# Patient Record
Sex: Male | Born: 1995 | Race: White | Hispanic: No | Marital: Single | Smoking: Never smoker
Health system: Southern US, Community
[De-identification: ages and names within clinical notes are randomized; demographics above are authoritative.]

## PROBLEM LIST (undated history)

## (undated) HISTORY — PX: TYMPANOSTOMY TUBE PLACEMENT: SHX32

## (undated) HISTORY — PX: ADENOIDECTOMY: SUR15

---
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
## 2015-12-04 ENCOUNTER — Encounter (HOSPITAL_COMMUNITY): Payer: Self-pay | Admitting: Emergency Medicine

## 2015-12-04 ENCOUNTER — Emergency Department (HOSPITAL_COMMUNITY): Payer: BLUE CROSS/BLUE SHIELD

## 2015-12-04 ENCOUNTER — Emergency Department (HOSPITAL_COMMUNITY)
Admission: EM | Admit: 2015-12-04 | Discharge: 2015-12-04 | Disposition: A | Discharge status: Home / Self Care | Payer: BLUE CROSS/BLUE SHIELD | Attending: Emergency Medicine | Admitting: Emergency Medicine

## 2015-12-04 DIAGNOSIS — S61411A Laceration without foreign body of right hand, initial encounter: Secondary | ICD-10-CM | POA: Insufficient documentation

## 2015-12-04 DIAGNOSIS — S80211A Abrasion, right knee, initial encounter: Secondary | ICD-10-CM | POA: Diagnosis not present

## 2015-12-04 DIAGNOSIS — Y999 Unspecified external cause status: Secondary | ICD-10-CM | POA: Insufficient documentation

## 2015-12-04 DIAGNOSIS — S1181XA Laceration without foreign body of other specified part of neck, initial encounter: Secondary | ICD-10-CM | POA: Diagnosis not present

## 2015-12-04 DIAGNOSIS — S0181XA Laceration without foreign body of other part of head, initial encounter: Secondary | ICD-10-CM | POA: Insufficient documentation

## 2015-12-04 DIAGNOSIS — Y929 Unspecified place or not applicable: Secondary | ICD-10-CM | POA: Diagnosis not present

## 2015-12-04 DIAGNOSIS — S6991XA Unspecified injury of right wrist, hand and finger(s), initial encounter: Secondary | ICD-10-CM | POA: Diagnosis present

## 2015-12-04 DIAGNOSIS — Y939 Activity, unspecified: Secondary | ICD-10-CM | POA: Insufficient documentation

## 2015-12-04 DIAGNOSIS — S01419A Laceration without foreign body of unspecified cheek and temporomandibular area, initial encounter: Secondary | ICD-10-CM | POA: Insufficient documentation

## 2015-12-04 MED ORDER — TETANUS-DIPHTH-ACELL PERTUSSIS 5-2.5-18.5 LF-MCG/0.5 IM SUSP
0.5000 mL | Freq: Once | INTRAMUSCULAR | Status: AC
  Administered 2015-12-04: 0.5 mL via INTRAMUSCULAR
  Filled 2015-12-04: qty 0.5

## 2015-12-04 MED ORDER — HYDROCODONE-ACETAMINOPHEN 5-325 MG PO TABS
1.0000 | ORAL_TABLET | Freq: Once | ORAL | Status: AC
  Administered 2015-12-04: 1 via ORAL
  Filled 2015-12-04: qty 1

## 2015-12-04 MED ORDER — BACITRACIN ZINC 500 UNIT/GM EX OINT
TOPICAL_OINTMENT | CUTANEOUS | Status: AC
  Filled 2015-12-04: qty 0.9
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
## 2015-12-04 NOTE — ED Notes (Signed)
Wounds cleansed with soap and water. Bacitracin applied. 2 steri-strips applied to left neck abrasions.

## 2015-12-05 NOTE — ED Provider Notes (Signed)
Pt's mother called ED: states pt did not receive rx for pain meds, mm relaxer, etc. EPIC chart reviewed: no rx noted. Rx written for #20 hydrocodone 5/APAP 325mg  tabs, #14 naproxen 250mg  tabs, #25 robaxin 500mg  tabs.   Samuel JesterKathleen Charliegh Vasudevan, DO 12/05/15 1430

## 2018-05-23 ENCOUNTER — Other Ambulatory Visit
Admission: RE | Admit: 2018-05-23 | Discharge: 2018-05-23 | Disposition: A | Discharge status: Home / Self Care | Attending: Family Medicine | Admitting: Family Medicine
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
## 2021-12-13 ENCOUNTER — Emergency Department: Payer: BC Managed Care – PPO

## 2021-12-13 ENCOUNTER — Emergency Department
Admission: EM | Admit: 2021-12-13 | Discharge: 2021-12-14 | Disposition: A | Discharge status: Home / Self Care | Payer: BC Managed Care – PPO | Attending: Emergency Medicine | Admitting: Emergency Medicine

## 2021-12-13 ENCOUNTER — Encounter: Payer: Self-pay | Admitting: Emergency Medicine

## 2021-12-13 DIAGNOSIS — Z23 Encounter for immunization: Secondary | ICD-10-CM | POA: Diagnosis not present

## 2021-12-13 DIAGNOSIS — S52611A Displaced fracture of right ulna styloid process, initial encounter for closed fracture: Secondary | ICD-10-CM | POA: Diagnosis not present

## 2021-12-13 DIAGNOSIS — S52571A Other intraarticular fracture of lower end of right radius, initial encounter for closed fracture: Secondary | ICD-10-CM | POA: Diagnosis not present

## 2021-12-13 DIAGNOSIS — Y92480 Sidewalk as the place of occurrence of the external cause: Secondary | ICD-10-CM | POA: Diagnosis not present

## 2021-12-13 DIAGNOSIS — S0990XA Unspecified injury of head, initial encounter: Secondary | ICD-10-CM | POA: Diagnosis not present

## 2021-12-13 DIAGNOSIS — S52501A Unspecified fracture of the lower end of right radius, initial encounter for closed fracture: Secondary | ICD-10-CM

## 2021-12-13 DIAGNOSIS — Y9351 Activity, roller skating (inline) and skateboarding: Secondary | ICD-10-CM | POA: Diagnosis not present

## 2021-12-13 DIAGNOSIS — W19XXXA Unspecified fall, initial encounter: Secondary | ICD-10-CM

## 2021-12-13 DIAGNOSIS — M25531 Pain in right wrist: Secondary | ICD-10-CM | POA: Diagnosis not present

## 2021-12-13 DIAGNOSIS — S6991XA Unspecified injury of right wrist, hand and finger(s), initial encounter: Secondary | ICD-10-CM | POA: Diagnosis present

## 2021-12-13 MED ORDER — FENTANYL CITRATE PF 50 MCG/ML IJ SOSY
100.0000 ug | PREFILLED_SYRINGE | Freq: Once | INTRAMUSCULAR | Status: AC
  Administered 2021-12-13: 100 ug via INTRAVENOUS
  Filled 2021-12-13: qty 2

## 2021-12-13 MED ORDER — TETANUS-DIPHTH-ACELL PERTUSSIS 5-2.5-18.5 LF-MCG/0.5 IM SUSY
0.5000 mL | PREFILLED_SYRINGE | Freq: Once | INTRAMUSCULAR | Status: AC
  Administered 2021-12-13: 0.5 mL via INTRAMUSCULAR
  Filled 2021-12-13: qty 0.5

## 2021-12-13 MED ORDER — CEPHALEXIN 500 MG PO CAPS
500.0000 mg | ORAL_CAPSULE | Freq: Once | ORAL | Status: AC
  Administered 2021-12-13: 500 mg via ORAL
  Filled 2021-12-13: qty 1

## 2021-12-13 MED ORDER — HYDROMORPHONE HCL 1 MG/ML IJ SOLN
0.5000 mg | Freq: Once | INTRAMUSCULAR | Status: AC
Start: 2021-12-13 — End: 2021-12-13
  Administered 2021-12-13: 0.5 mg via INTRAVENOUS
  Filled 2021-12-13: qty 0.5

## 2021-12-13 MED ORDER — HYDROMORPHONE HCL 1 MG/ML IJ SOLN
1.0000 mg | Freq: Once | INTRAMUSCULAR | Status: AC
  Administered 2021-12-13: 1 mg via INTRAVENOUS
  Filled 2021-12-13: qty 1

## 2021-12-13 MED ORDER — LIDOCAINE-EPINEPHRINE 2 %-1:100000 IJ SOLN
20.0000 mL | Freq: Once | INTRAMUSCULAR | Status: AC
  Administered 2021-12-13: 20 mL
  Filled 2021-12-13: qty 1

## 2021-12-13 MED ORDER — CEPHALEXIN 500 MG PO CAPS: 500.0000 mg | ORAL_CAPSULE | Freq: Three times a day (TID) | ORAL | 0 refills | Status: AC

## 2021-12-13 MED ORDER — OXYCODONE HCL 5 MG PO TABS: 5.0000 mg | ORAL_TABLET | Freq: Three times a day (TID) | ORAL | 0 refills | Status: AC | PRN
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
## 2022-12-06 IMAGING — CR DG SHOULDER 1V*R*
2 series · 2 of 2 positions shown · non-contrast
Comparison: None Available.

CLINICAL DATA: Recent skateboard injury with right shoulder pain,
initial encounter

EXAM:
RIGHT SHOULDER - 1 VIEW

[shoulder grashey]
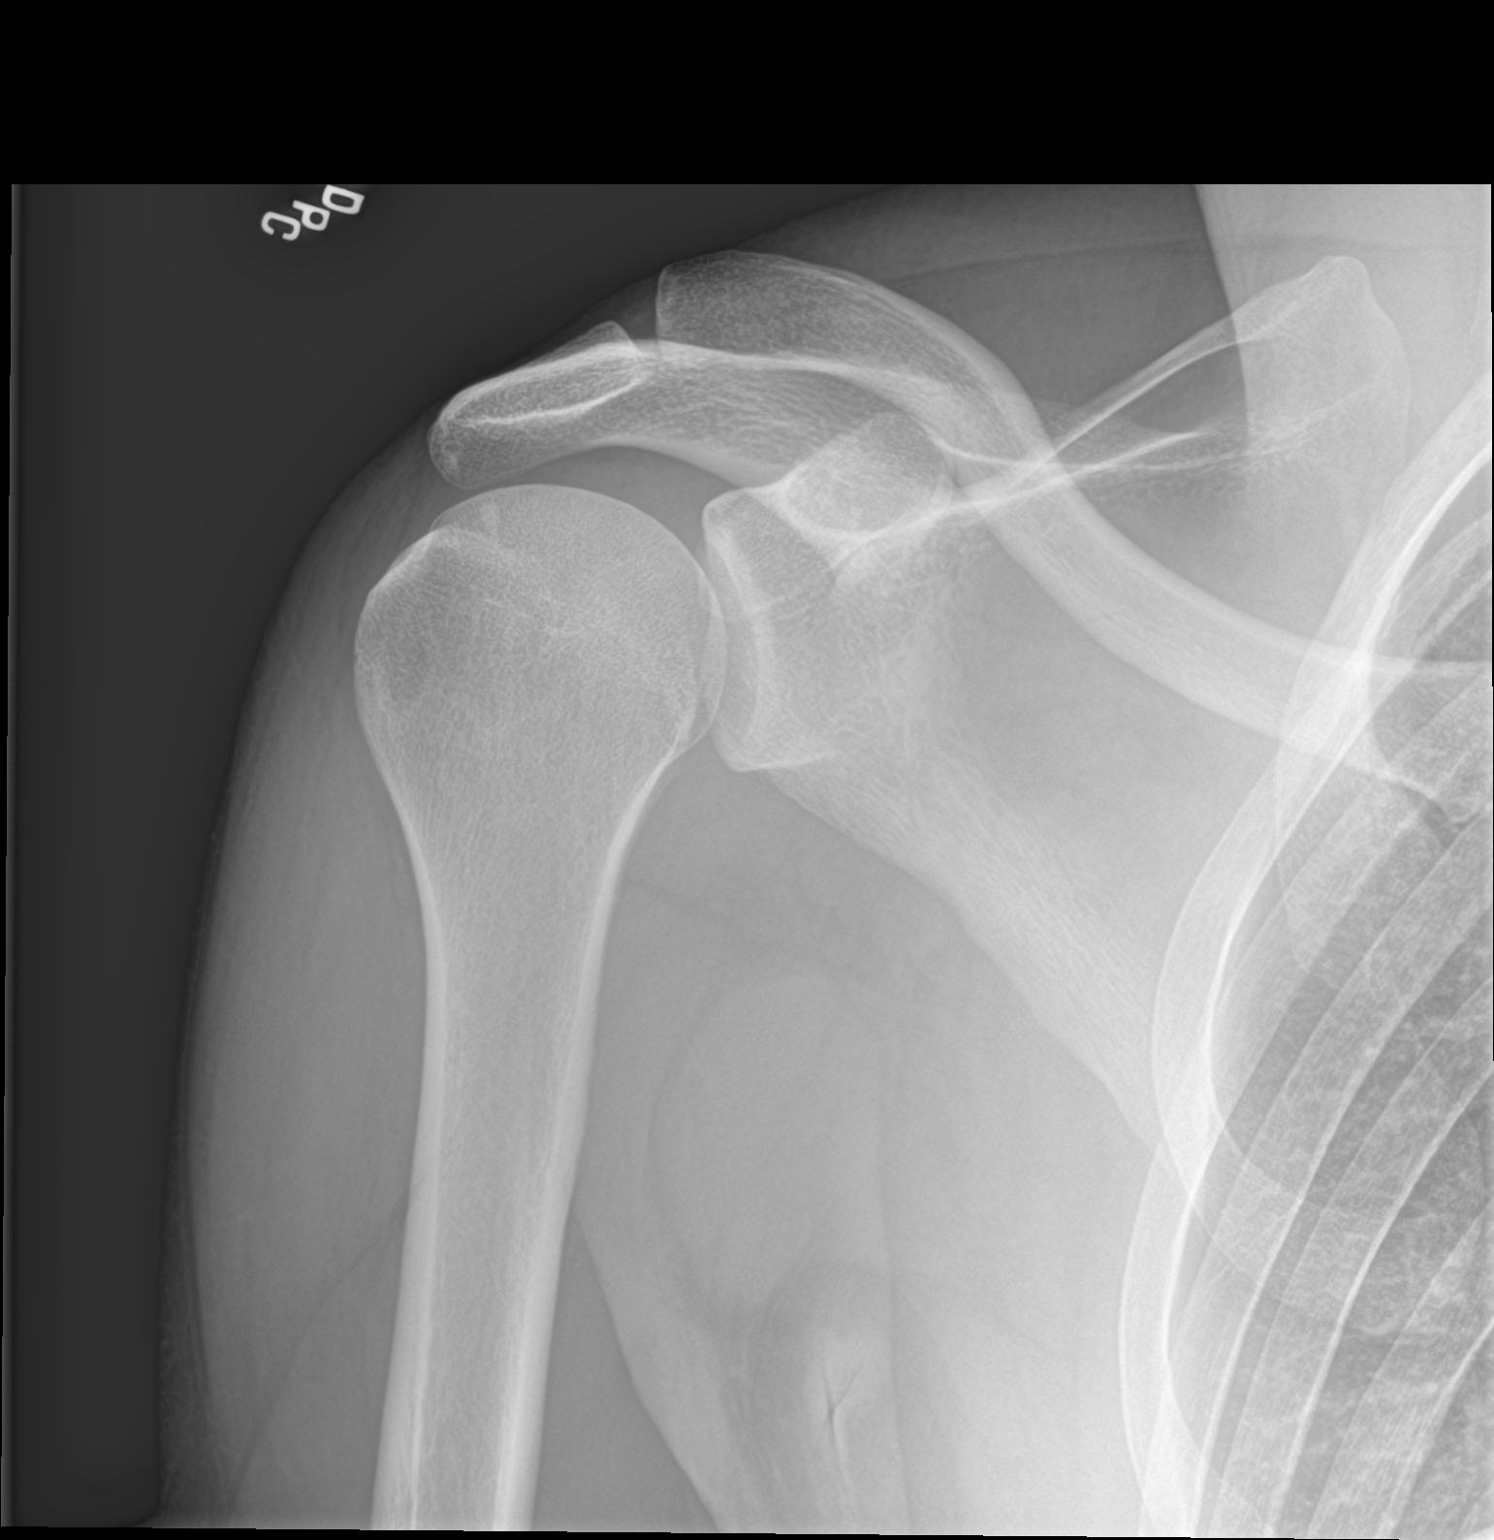

[scapula lat]
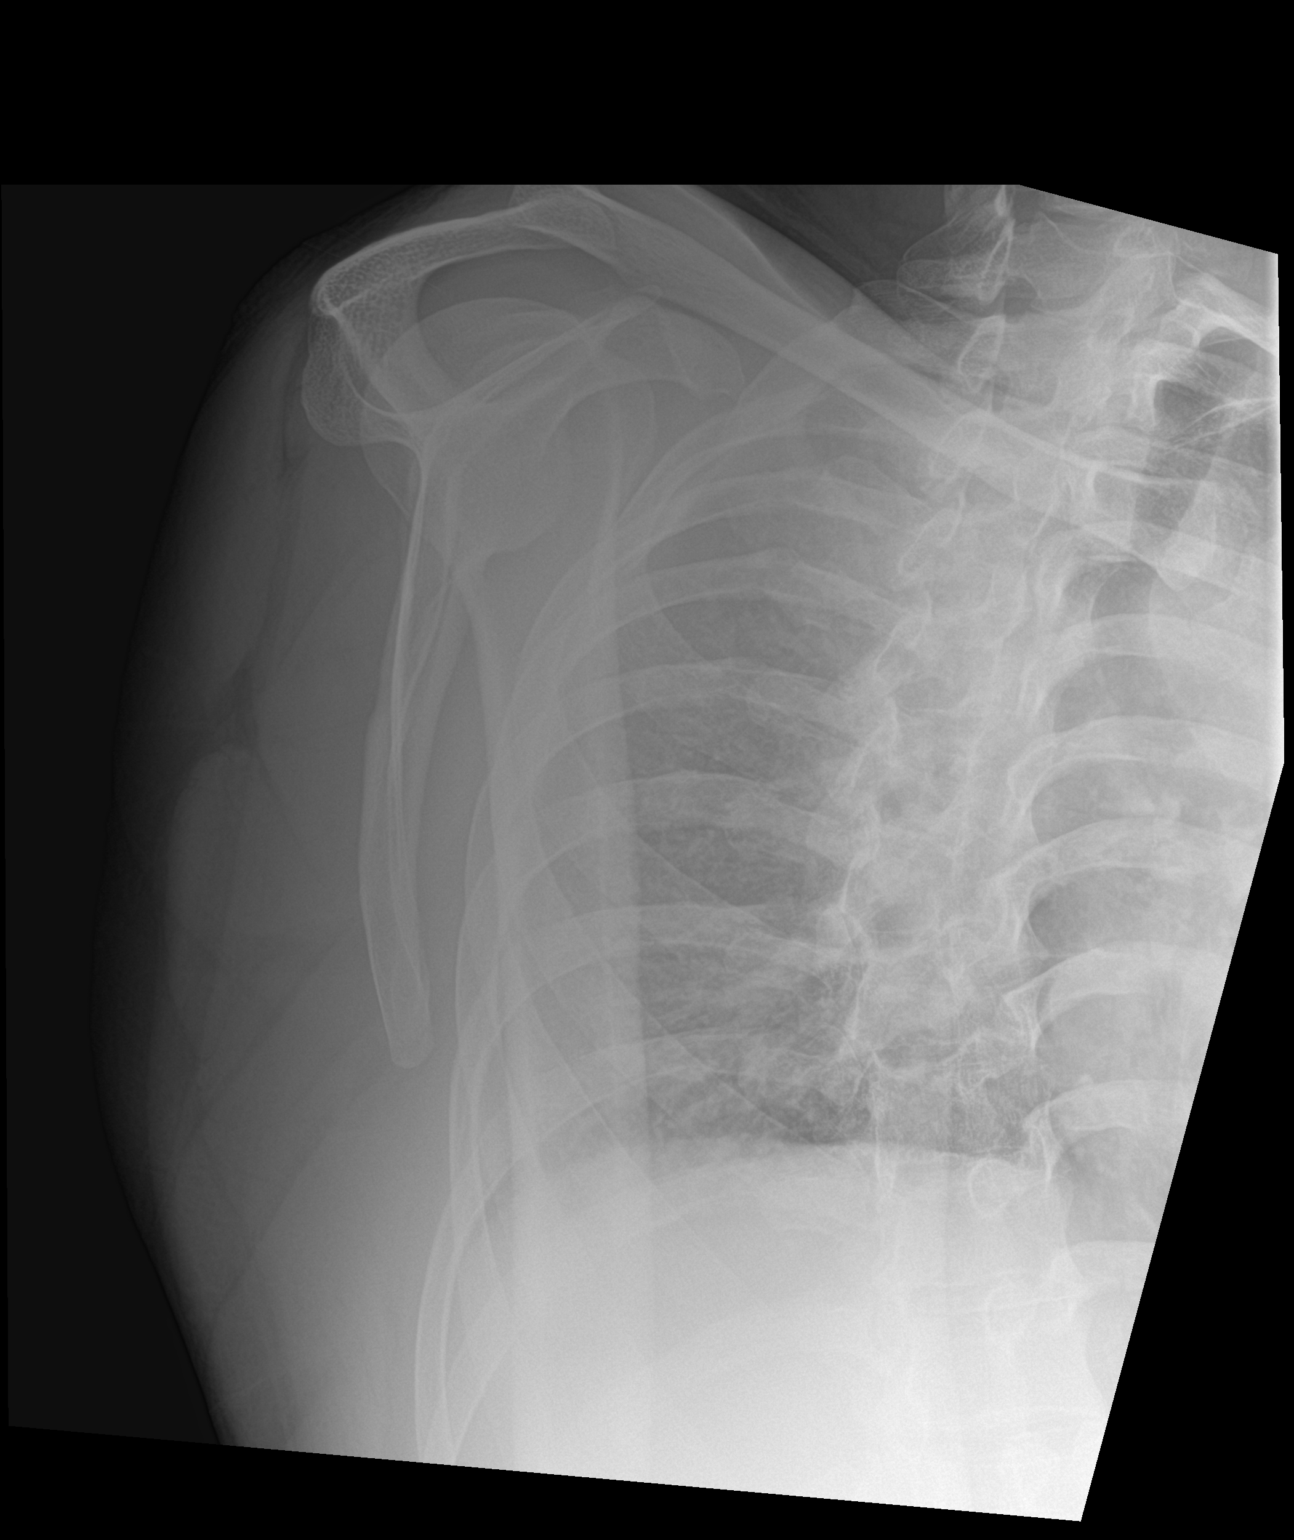

[2 of 2 positions shown; findings below may reference images not displayed]

FINDINGS: There is no evidence of fracture or dislocation. There is no
evidence of arthropathy or other focal bone abnormality. Soft
tissues are unremarkable.
IMPRESSION: No acute abnormality noted.
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
## 2024-06-11 ENCOUNTER — Other Ambulatory Visit (HOSPITAL_COMMUNITY): Payer: Self-pay
